# Patient Record
Sex: Female | Born: 1980 | Race: Black or African American | Hispanic: No | Marital: Single | Smoking: Never smoker
Health system: Southern US, Community
[De-identification: ages and names within clinical notes are randomized; demographics above are authoritative.]

---

## 2008-08-02 ENCOUNTER — Inpatient Hospital Stay (HOSPITAL_COMMUNITY): Admission: AD | Admit: 2008-08-02 | Discharge: 2008-08-06 | Payer: Self-pay | Admitting: Obstetrics and Gynecology

## 2008-08-03 ENCOUNTER — Encounter (INDEPENDENT_AMBULATORY_CARE_PROVIDER_SITE_OTHER): Payer: Self-pay | Admitting: Obstetrics

## 2010-04-15 LAB — CBC
Hemoglobin: 11.2 g/dL — ABNORMAL LOW (ref 12.0–15.0)
MCHC: 31.9 g/dL (ref 30.0–36.0)
MCV: 66.4 fL — ABNORMAL LOW (ref 78.0–100.0)
Platelets: 148 10*3/uL — ABNORMAL LOW (ref 150–400)
RDW: 16 % — ABNORMAL HIGH (ref 11.5–15.5)
WBC: 19.8 10*3/uL — ABNORMAL HIGH (ref 4.0–10.5)

## 2010-04-15 LAB — RPR: RPR Ser Ql: NONREACTIVE

## 2010-05-22 NOTE — Op Note (Signed)
Shelley English, Shelley English NO.:  [id number]   MEDICAL RECORD NO.:  [id number]           PATIENT TYPE:   LOCATION:                                 FACILITY:   PHYSICIAN:  Lendon Colonel, MD   DATE OF BIRTH:  Feb 15, 1980   DATE OF PROCEDURE:  08/03/2008  DATE OF DISCHARGE:                               OPERATIVE REPORT   PREOPERATIVE DIAGNOSIS:  Fetal intolerance to labor, arrest of descent,  persistent OP position.   POSTOPERATIVE DIAGNOSIS:  Fetal intolerance to labor, arrest of descent,  persistent OP position.   PROCEDURE:  Primary low transverse cesarean section.   SURGEON:  Alphonsus Sias. Ernestina Penna, MD   ASSISTANT:  None.   ANESTHESIA:  Epidural.   FINDINGS:  Female infant in the ROP position, Apgars 9 and 9, weight 5  pounds 9 ounces.  Normal ovaries, normal tubes.  Small 2 cm subserosal  fibroid in the right fundus, additional 2 cm fibroid in the left broad  ligament.   SPECIMENS:  Placenta to Pathology.   ANTIBIOTICS:  2 g of Ancef.   ESTIMATED BLOOD LOSS:  500.   COMPLICATIONS:  None.   INDICATIONS:  This is a 30 year old G1 at 39 weeks and 2 days who  presented for cervical ripening given chronic hypertension and mature  placenta on ultrasound.  The patient had cervical ripening overnight and  Pitocin induction; however, secondary to recurrent fetal decelerations,  Pitocin was discontinued.  The patient proceeded to progress into labor  on her own.  This was augmented with artificial rupture of membranes.  The patient throughout her labor course did have fetal decelerations  variable in nature; however, towards the end of the labor, these became  more pronounced, more prolonged, and more late in timing over the last  hour of labor.  The patient had late decelerations lasting 2-3 minutes  with most contractions going down into the 80s.  The patient also had  not changed her cervix passed the 9 cm and no fetal descent passed the 0  station, significant  molding and overriding sutures were noticed.  The  decision was made to proceed with the C-section.   PROCEDURE:  After informed consent was obtained, the patient was taken  to the operating room where epidural anesthesia was found to be  adequate.  2 g of Ancef were given and Foley catheter had already been  in place from the labor floor.  A Pfannenstiel skin incision was made 2  cm above the pubic symphysis in the midline with scalpel, carried  through to the underlying layer of fascia with Bovie cautery.  The  fascia was incised in midline.  The incision was extended laterally.  The inferior aspect of the fascial incision was grasped with Kocher  clamps, elevated up, and the underlying rectus muscles dissected off  sharply.  Superior aspect of the fascial incision was grasped with  Kocher clamps, elevated up, and the underlying rectus muscles dissected  off sharply.  The rectus muscles were separated in the midline.  Peritoneum was identified, entered bluntly.  Peritoneal incision was  extended superiorly and inferiorly with good visualization of the  bladder.  The bladder blade was inserted.  The lower uterine segment was  incised in transverse fashion with scalpel and extended bluntly.  The  infant's occiput was deep in the pelvis in the ROP position.  Once the  head was able to be turned flex, was brought through the incision, the  infant was delivered without complication.  No nuchal cord was noted.  The cord was clamped and cut.  The infant was handed off to the awaiting  pediatrician.  Attempt to send a cord gas was unsuccessful.  The  placenta was expressed.  The uterus was exteriorized, cleared of all  clots and debris.  The uterine incision was inspected.  A significant  left inferior extension was noted.  This was repaired primarily with 0  Vicryl in a running locked fashion.  The main uterine incision was then  repaired with 0 Vicryl in a running locked fashion.  The  extension was  then imbricated.  This extension came up to the right apex of the  incision and the imbrication was carried across to the left apex.  Several additional figure-of-eight sutures were placed down along the  extension for additional hemostasis.  The uterus was then returned to  the abdomen.  The gutters were cleared of all clots and debris.  The  uterine incision was re-inspected, found to be hemostatic.  The  peritoneum was closed with 2-0 Vicryl in a running fashion.  The cut  muscle edges on either side of the fascia were inspected, found to be  hemostatic.  The fascia was closed with 0 Vicryl in a running fashion in  a single layer.  The subcutaneous tissue was irrigated.  Several small  capillary bleeders were Bovie cauterized for hemostasis and the  subcutaneous tissue was closed with a 2-0 plain on a large needle.  Skin  was then closed with staples.  The patient tolerated the procedure well.  Sponge, lap, and needle counts were correct x3.  The patient was taken  to the recovery room in stable condition.      Lendon Colonel, MD  Electronically Signed     KAF/MEDQ  D:  08/03/2008  T:  08/04/2008  Job:  [id number]

## 2017-11-18 ENCOUNTER — Encounter (HOSPITAL_COMMUNITY): Payer: Self-pay | Admitting: Emergency Medicine

## 2017-11-18 ENCOUNTER — Other Ambulatory Visit: Payer: Self-pay

## 2017-11-18 ENCOUNTER — Emergency Department (HOSPITAL_COMMUNITY)
Admission: EM | Admit: 2017-11-18 | Discharge: 2017-11-19 | Disposition: A | Discharge status: Home / Self Care | Payer: BLUE CROSS/BLUE SHIELD | Attending: Emergency Medicine | Admitting: Emergency Medicine

## 2017-11-18 DIAGNOSIS — N200 Calculus of kidney: Secondary | ICD-10-CM | POA: Insufficient documentation

## 2017-11-18 DIAGNOSIS — R109 Unspecified abdominal pain: Secondary | ICD-10-CM | POA: Diagnosis present

## 2017-11-18 MED ORDER — ONDANSETRON HCL 4 MG/2ML IJ SOLN
4.0000 mg | Freq: Once | INTRAMUSCULAR | Status: AC
Start: 2017-11-18 — End: 2017-11-18
  Administered 2017-11-18: 4 mg via INTRAVENOUS
  Filled 2017-11-18: qty 2

## 2017-11-18 MED ORDER — KETOROLAC TROMETHAMINE 30 MG/ML IJ SOLN
30.0000 mg | Freq: Once | INTRAMUSCULAR | Status: AC
Start: 2017-11-18 — End: 2017-11-18
  Administered 2017-11-18: 30 mg via INTRAVENOUS
  Filled 2017-11-18: qty 1

## 2017-11-18 NOTE — ED Triage Notes (Signed)
Pt reports sudden onset  R flank pain tonight. Pt reports nausea, no vomiting. Pt denies changes in urination.

## 2017-11-18 NOTE — ED Provider Notes (Signed)
TIME SEEN: 11:12 PM  CHIEF COMPLAINT: Right flank pain  HPI: Patient is a 37 year old female with no significant past medical history who presents to the emergency department with sudden onset right-sided flank pain that started tonight at 7:30 PM.  No injury to her back that she can recall.  Pain is not worse with movement.  She does not notice any aggravating or relieving factors.  Describes it as sharp, severe, waxing and waning.  Did have nausea and vomiting.  No previous history of kidney stone.  No diarrhea.  No fever.  No dysuria or hematuria.  Has had previous C-section.  Denies numbness, tingling or focal weakness.  ROS: See HPI Constitutional: no fever  Eyes: no drainage  ENT: no runny nose   Cardiovascular:  no chest pain  Resp: no SOB  GI:  vomiting GU: no dysuria Integumentary: no rash  Allergy: no hives  Musculoskeletal: no leg swelling  Neurological: no slurred speech ROS otherwise negative  PAST MEDICAL HISTORY/PAST SURGICAL HISTORY:  History reviewed. No pertinent past medical history.  MEDICATIONS:  Prior to Admission medications   Not on File    ALLERGIES:  No Known Allergies  SOCIAL HISTORY:  Social History   Tobacco Use  . Smoking status: Never Smoker  . Smokeless tobacco: Never Used  Substance Use Topics  . Alcohol use: Never    Frequency: Never    FAMILY HISTORY: No family history on file.  EXAM: BP 113/72 (BP Location: Right Arm)   Pulse 80   Temp 98 F (36.7 C) (Oral)   Resp 18   Ht 5\' 1"  (1.549 m)   Wt 90.7 kg   SpO2 100%   BMI 37.79 kg/m  CONSTITUTIONAL: Alert and oriented and responds appropriately to questions. Well-appearing; well-nourished HEAD: Normocephalic EYES: Conjunctivae clear, pupils appear equal, EOMI ENT: normal nose; moist mucous membranes NECK: Supple, no meningismus, no nuchal rigidity, no LAD  CARD: RRR; S1 and S2 appreciated; no murmurs, no clicks, no rubs, no gallops RESP: Normal chest excursion without  splinting or tachypnea; breath sounds clear and equal bilaterally; no wheezes, no rhonchi, no rales, no hypoxia or respiratory distress, speaking full sentences ABD/GI: Normal bowel sounds; non-distended; soft, non-tender, no rebound, no guarding, no peritoneal signs, no hepatosplenomegaly, no tenderness over McBurney's point, negative Murphy sign BACK:  The back appears normal and is non-tender to palpation, there is no CVA tenderness, no midline spinal tenderness or step-off or deformity EXT: Normal ROM in all joints; non-tender to palpation; no edema; normal capillary refill; no cyanosis, no calf tenderness or swelling    SKIN: Normal color for age and race; warm; no rash NEURO: Moves all extremities equally normal gait PSYCH: The patient's mood and manner are appropriate. Grooming and personal hygiene are appropriate.  MEDICAL DECISION MAKING: Patient here with sudden onset flank pain.  I am concerned for possible kidney stone.  Seems less likely musculoskeletal in nature given sudden onset no history of injury to the back.  Doubt fracture, cauda equina, epidural abscess or hematoma, discitis or osteomyelitis, transverse myelitis.  She has no tenderness over her gallbladder or appendix at this time.  Will obtain labs, urine, CT imaging.  We will treat symptomatically with Toradol and Zofran.  ED PROGRESS: 1:40 AM  CT scan shows diffuse right-sided perinephric stranding and fluid with a 2 mm stone at the right distal ureter at the UVJ.  She has moderate right-sided hydronephrosis.  Reports no significant improvement after Toradol.  She now has someone  here who can drive her home.  Will give dose of morphine.  Urine pending.   2:20 AM  Pt feels her pain is been well controlled after morphine.  Urine shows no sign of infection.  She does have small leukocytes and rare bacteria but also many squamous cells.  I feel she is safe for discharge home with outpatient urology follow-up.  Will discharge with  urine strainer.  Will discharge with prescriptions for Percocet, Zofran, Flomax, ibuprofen.  Discussed return precautions.   At this time, I do not feel there is any life-threatening condition present. I have reviewed and discussed all results (EKG, imaging, lab, urine as appropriate) and exam findings with patient/family. I have reviewed nursing notes and appropriate previous records.  I feel the patient is safe to be discharged home without further emergent workup and can continue workup as an outpatient as needed. Discussed usual and customary return precautions. Patient/family verbalize understanding and are comfortable with this plan.  Outpatient follow-up has been provided if needed. All questions have been answered.     Ares Cardozo, Layla Maw, DO 11/19/17 Earle Gell

## 2017-11-19 ENCOUNTER — Emergency Department (HOSPITAL_COMMUNITY): Payer: BLUE CROSS/BLUE SHIELD

## 2017-11-19 LAB — COMPREHENSIVE METABOLIC PANEL
ALK PHOS: 65 U/L (ref 38–126)
ALT: 10 U/L (ref 0–44)
ANION GAP: 8 (ref 5–15)
AST: 17 U/L (ref 15–41)
Albumin: 3.7 g/dL (ref 3.5–5.0)
BUN: 10 mg/dL (ref 6–20)
CO2: 20 mmol/L — ABNORMAL LOW (ref 22–32)
CREATININE: 0.82 mg/dL (ref 0.44–1.00)
Calcium: 8.5 mg/dL — ABNORMAL LOW (ref 8.9–10.3)
Chloride: 108 mmol/L (ref 98–111)
Glucose, Bld: 107 mg/dL — ABNORMAL HIGH (ref 70–99)
Potassium: 3.8 mmol/L (ref 3.5–5.1)
Sodium: 136 mmol/L (ref 135–145)
Total Bilirubin: 0.6 mg/dL (ref 0.3–1.2)
Total Protein: 7.4 g/dL (ref 6.5–8.1)

## 2017-11-19 LAB — CBC WITH DIFFERENTIAL/PLATELET
Abs Immature Granulocytes: 0.05 10*3/uL (ref 0.00–0.07)
BASOS ABS: 0.1 10*3/uL (ref 0.0–0.1)
Basophils Relative: 0 %
EOS ABS: 0 10*3/uL (ref 0.0–0.5)
Eosinophils Relative: 0 %
HCT: 33.5 % — ABNORMAL LOW (ref 36.0–46.0)
HEMOGLOBIN: 9 g/dL — AB (ref 12.0–15.0)
Immature Granulocytes: 0 %
LYMPHS ABS: 1.8 10*3/uL (ref 0.7–4.0)
Lymphocytes Relative: 14 %
MCH: 16.9 pg — ABNORMAL LOW (ref 26.0–34.0)
MCHC: 26.9 g/dL — AB (ref 30.0–36.0)
MCV: 62.9 fL — ABNORMAL LOW (ref 80.0–100.0)
Monocytes Absolute: 0.4 10*3/uL (ref 0.1–1.0)
Monocytes Relative: 3 %
NRBC: 0 % (ref 0.0–0.2)
Neutro Abs: 10.5 10*3/uL — ABNORMAL HIGH (ref 1.7–7.7)
Neutrophils Relative %: 83 %
Platelets: 297 10*3/uL (ref 150–400)
RBC: 5.33 MIL/uL — ABNORMAL HIGH (ref 3.87–5.11)
RDW: 18.6 % — ABNORMAL HIGH (ref 11.5–15.5)
WBC: 12.9 10*3/uL — ABNORMAL HIGH (ref 4.0–10.5)

## 2017-11-19 LAB — URINALYSIS, ROUTINE W REFLEX MICROSCOPIC
BILIRUBIN URINE: NEGATIVE
GLUCOSE, UA: NEGATIVE mg/dL
HGB URINE DIPSTICK: NEGATIVE
KETONES UR: NEGATIVE mg/dL
NITRITE: NEGATIVE
PROTEIN: NEGATIVE mg/dL
Specific Gravity, Urine: 1.021 (ref 1.005–1.030)
pH: 7 (ref 5.0–8.0)

## 2017-11-19 LAB — I-STAT BETA HCG BLOOD, ED (MC, WL, AP ONLY)

## 2017-11-19 LAB — LIPASE, BLOOD: Lipase: 27 U/L (ref 11–51)

## 2017-11-19 MED ORDER — OXYCODONE-ACETAMINOPHEN 5-325 MG PO TABS: 2.0000 | ORAL_TABLET | Freq: Four times a day (QID) | ORAL | 0 refills | Status: DC | PRN

## 2017-11-19 MED ORDER — IBUPROFEN 800 MG PO TABS: 800.0000 mg | ORAL_TABLET | Freq: Three times a day (TID) | ORAL | 0 refills | Status: DC | PRN

## 2017-11-19 MED ORDER — TAMSULOSIN HCL 0.4 MG PO CAPS: 0.4000 mg | ORAL_CAPSULE | Freq: Every day | ORAL | 0 refills | Status: DC

## 2017-11-19 MED ORDER — MORPHINE SULFATE (PF) 4 MG/ML IV SOLN
4.0000 mg | Freq: Once | INTRAVENOUS | Status: AC
  Administered 2017-11-19: 4 mg via INTRAVENOUS
  Filled 2017-11-19: qty 1

## 2017-11-19 MED ORDER — ONDANSETRON 4 MG PO TBDP: 4.0000 mg | ORAL_TABLET | Freq: Four times a day (QID) | ORAL | 0 refills | Status: DC | PRN

## 2017-11-19 NOTE — ED Notes (Signed)
ED Provider at bedside. 

## 2018-06-15 ENCOUNTER — Other Ambulatory Visit: Payer: Self-pay

## 2018-06-15 ENCOUNTER — Other Ambulatory Visit: Payer: BLUE CROSS/BLUE SHIELD

## 2018-06-15 ENCOUNTER — Other Ambulatory Visit: Payer: Self-pay | Admitting: Internal Medicine

## 2018-06-15 DIAGNOSIS — Z20822 Contact with and (suspected) exposure to covid-19: Secondary | ICD-10-CM

## 2018-06-18 LAB — NOVEL CORONAVIRUS, NAA: SARS-CoV-2, NAA: NOT DETECTED

## 2019-05-20 IMAGING — CT CT RENAL STONE PROTOCOL
2 of 4 series · 16 of 46 positions shown, 18 images · non-contrast
Comparison: None.

CLINICAL DATA: Acute onset of right flank pain and nausea.

EXAM:
CT ABDOMEN AND PELVIS WITHOUT CONTRAST
TECHNIQUE: Multidetector CT imaging of the abdomen and pelvis was performed
following the standard protocol without IV contrast.

[Series 3: renal stone 5.0 · axial · 0.95mm/px · z∈[+924,+1354]mm · 13 of 94 slices shown, 15 images]
[im 4/94  soft-tissue]
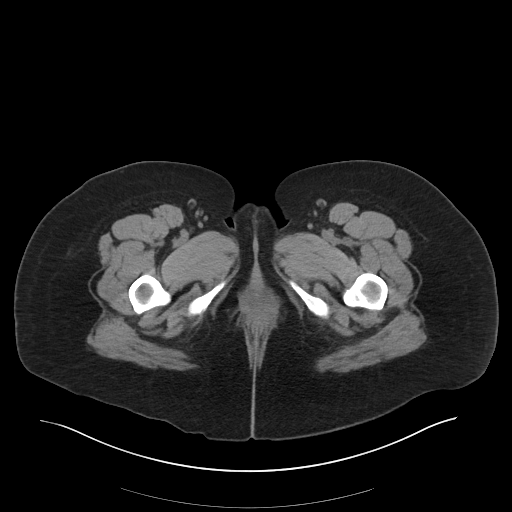
[im 4/94  bone]
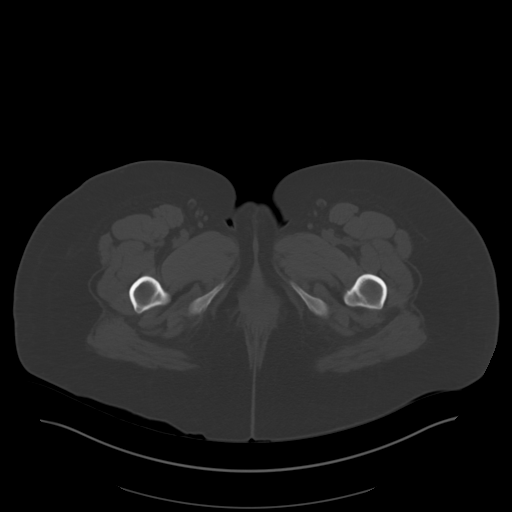
[im 12/94  soft-tissue]
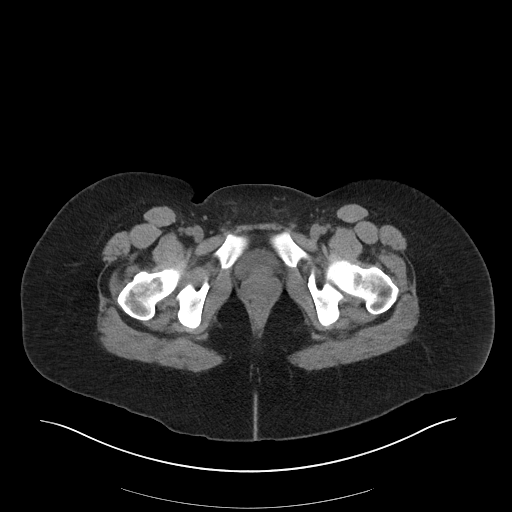
[im 20/94  soft-tissue]
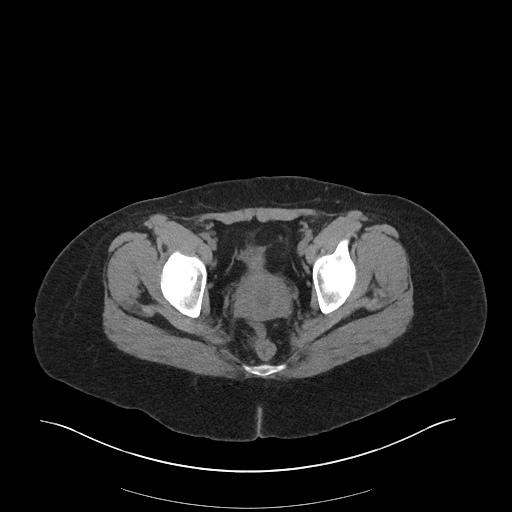
[im 28/94  soft-tissue]
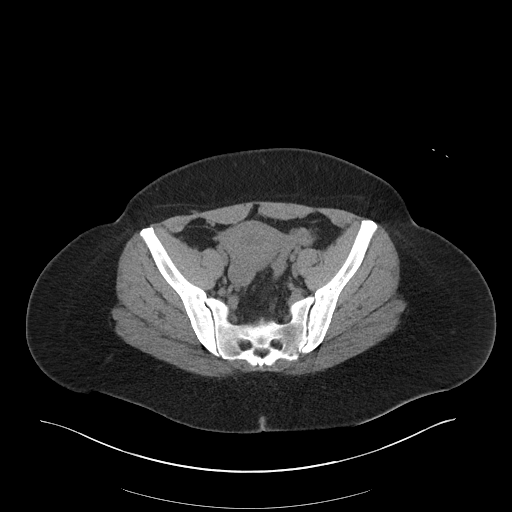
[im 32/94  soft-tissue]
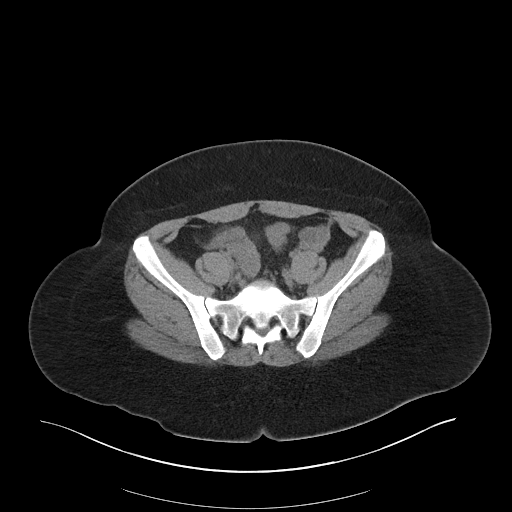
[im 39/94  soft-tissue]
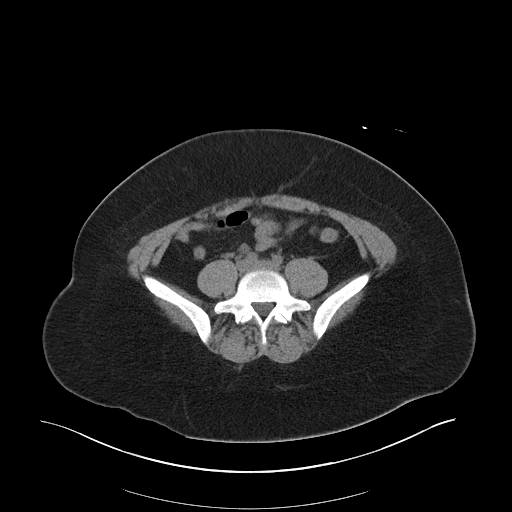
[im 47/94  soft-tissue]
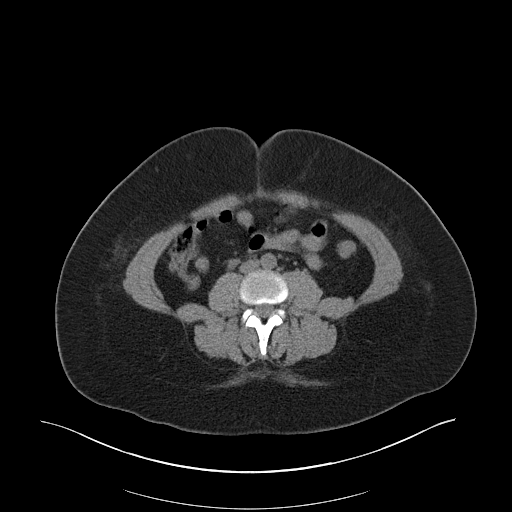
[im 55/94  soft-tissue]
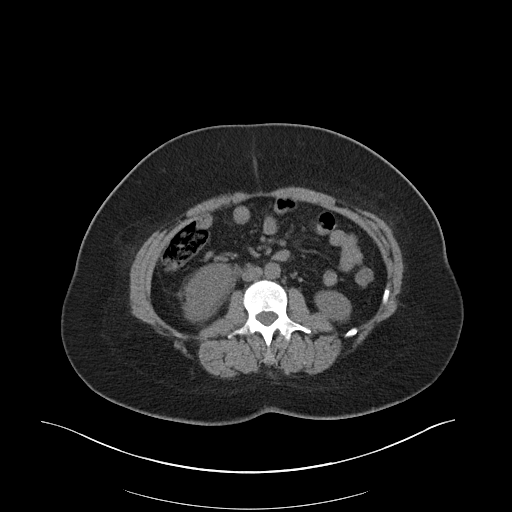
[im 63/94  soft-tissue]
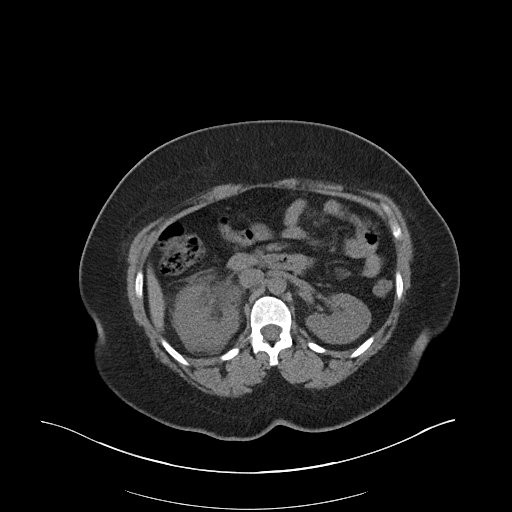
[im 63/94  bone]
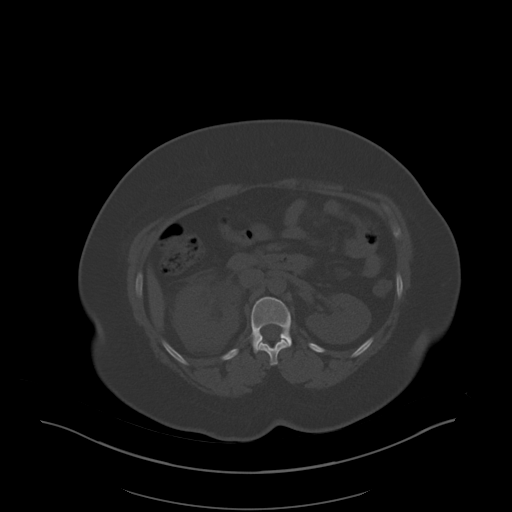
[im 66/94  soft-tissue]
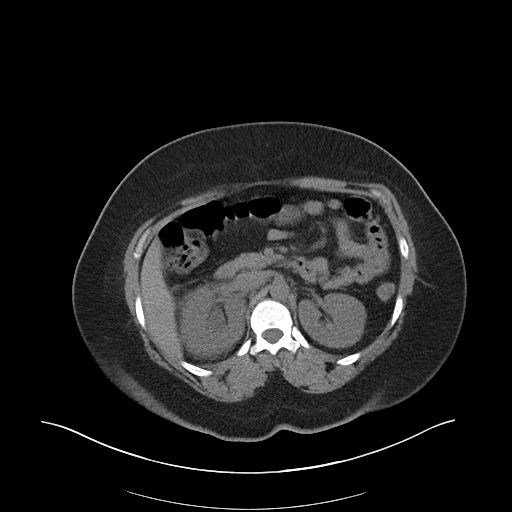
[im 74/94  soft-tissue]
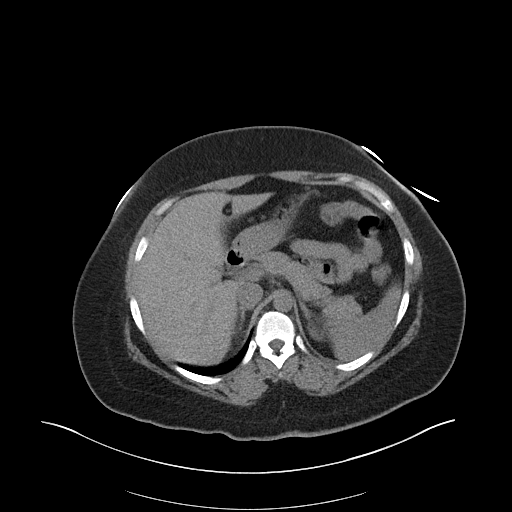
[im 82/94  soft-tissue]
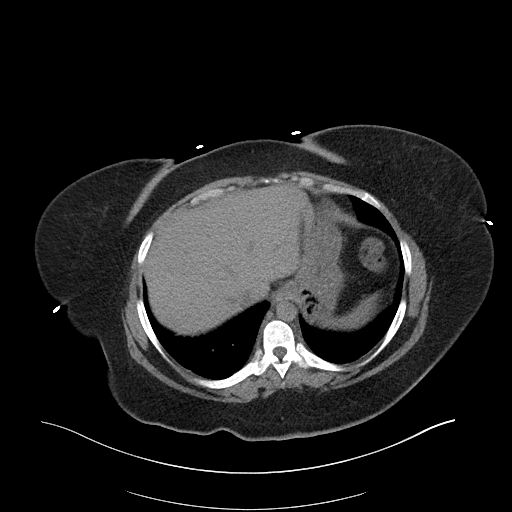
[im 90/94  soft-tissue]
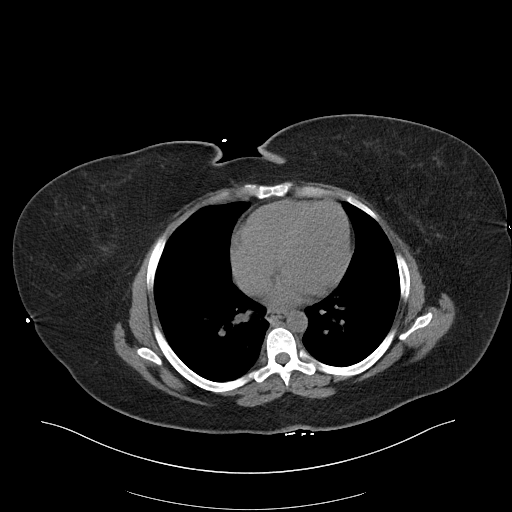

[Series 6: renal stone 3.0 cor · coronal · 0.92mm/px · 3 of 84 slices shown]
[im 28/84  soft-tissue]
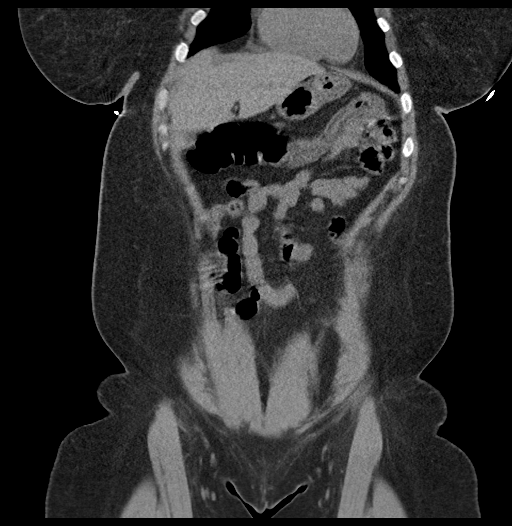
[im 37/84  soft-tissue]
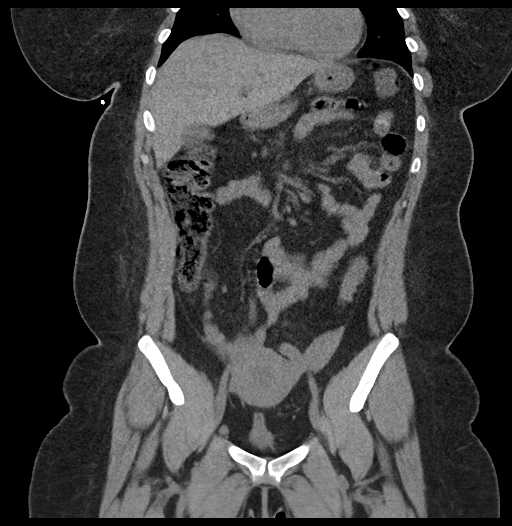
[im 47/84  soft-tissue]
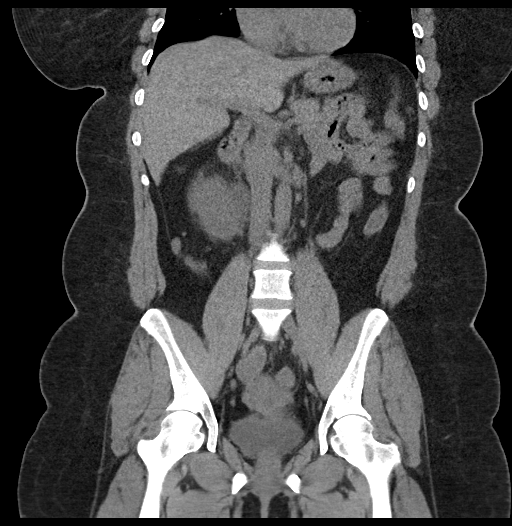

[16 of 46 positions shown; findings below may reference images not displayed]

FINDINGS: Lower chest: Minimal right basilar atelectasis is noted. The
visualized portions of the mediastinum are unremarkable.

Hepatobiliary: The liver is unremarkable in appearance. The
gallbladder is unremarkable in appearance. The common bile duct
remains normal in caliber.

Pancreas: The pancreas is within normal limits.

Spleen: The spleen is unremarkable in appearance.

Adrenals/Urinary Tract: The adrenal glands are unremarkable in
appearance.

Moderate right-sided hydronephrosis is noted, with prominence of the
right ureter along its entire course. A 2 mm stone is noted at the
distal right ureter, along the right vesicoureteral junction.
Diffuse right-sided perinephric stranding and fluid are seen. The
left kidney is unremarkable. No nonobstructing renal stones are
identified.

Stomach/Bowel: The stomach is unremarkable in appearance. The small
bowel is within normal limits. The appendix is normal in caliber,
without evidence of appendicitis. A few diverticula are noted along
the descending colon. The colon is otherwise unremarkable.

Vascular/Lymphatic: The abdominal aorta is unremarkable in
appearance. The inferior vena cava is grossly unremarkable. No
retroperitoneal lymphadenopathy is seen. No pelvic sidewall
lymphadenopathy is identified.

Reproductive: The bladder is decompressed and not well
characterized. The uterus is unremarkable in appearance. The ovaries
are relatively symmetric. No suspicious adnexal masses are seen.

Other: No additional soft tissue abnormalities are seen.

Musculoskeletal: No acute osseous abnormalities are identified. The
visualized musculature is unremarkable in appearance.
IMPRESSION: Moderate right-sided hydronephrosis, with diffuse right-sided
perinephric stranding and fluid. 2 mm stone at the distal right
ureter, along the right vesicoureteral junction.

## 2020-01-22 ENCOUNTER — Other Ambulatory Visit: Payer: BC Managed Care – PPO

## 2020-01-22 DIAGNOSIS — Z20822 Contact with and (suspected) exposure to covid-19: Secondary | ICD-10-CM

## 2020-01-25 LAB — NOVEL CORONAVIRUS, NAA: SARS-CoV-2, NAA: NOT DETECTED

## 2020-07-12 DIAGNOSIS — Z124 Encounter for screening for malignant neoplasm of cervix: Secondary | ICD-10-CM | POA: Diagnosis not present

## 2020-07-12 DIAGNOSIS — N926 Irregular menstruation, unspecified: Secondary | ICD-10-CM | POA: Diagnosis not present

## 2020-07-12 DIAGNOSIS — Z114 Encounter for screening for human immunodeficiency virus [HIV]: Secondary | ICD-10-CM | POA: Diagnosis not present

## 2020-07-12 DIAGNOSIS — Z1159 Encounter for screening for other viral diseases: Secondary | ICD-10-CM | POA: Diagnosis not present

## 2020-07-12 DIAGNOSIS — Z01419 Encounter for gynecological examination (general) (routine) without abnormal findings: Secondary | ICD-10-CM | POA: Diagnosis not present

## 2020-07-12 DIAGNOSIS — Z6841 Body Mass Index (BMI) 40.0 and over, adult: Secondary | ICD-10-CM | POA: Diagnosis not present

## 2020-07-12 DIAGNOSIS — Z113 Encounter for screening for infections with a predominantly sexual mode of transmission: Secondary | ICD-10-CM | POA: Diagnosis not present

## 2020-07-12 DIAGNOSIS — Z118 Encounter for screening for other infectious and parasitic diseases: Secondary | ICD-10-CM | POA: Diagnosis not present

## 2020-12-21 DIAGNOSIS — Z0001 Encounter for general adult medical examination with abnormal findings: Secondary | ICD-10-CM | POA: Diagnosis not present

## 2020-12-21 DIAGNOSIS — Z13220 Encounter for screening for lipoid disorders: Secondary | ICD-10-CM | POA: Diagnosis not present

## 2020-12-21 DIAGNOSIS — Z1331 Encounter for screening for depression: Secondary | ICD-10-CM | POA: Diagnosis not present

## 2020-12-21 DIAGNOSIS — R030 Elevated blood-pressure reading, without diagnosis of hypertension: Secondary | ICD-10-CM | POA: Diagnosis not present

## 2020-12-21 DIAGNOSIS — Z6841 Body Mass Index (BMI) 40.0 and over, adult: Secondary | ICD-10-CM | POA: Diagnosis not present

## 2021-12-21 DIAGNOSIS — E559 Vitamin D deficiency, unspecified: Secondary | ICD-10-CM | POA: Diagnosis not present

## 2021-12-21 DIAGNOSIS — Z6841 Body Mass Index (BMI) 40.0 and over, adult: Secondary | ICD-10-CM | POA: Diagnosis not present

## 2021-12-21 DIAGNOSIS — Z1331 Encounter for screening for depression: Secondary | ICD-10-CM | POA: Diagnosis not present

## 2021-12-21 DIAGNOSIS — Z0001 Encounter for general adult medical examination with abnormal findings: Secondary | ICD-10-CM | POA: Diagnosis not present

## 2021-12-21 DIAGNOSIS — D563 Thalassemia minor: Secondary | ICD-10-CM | POA: Diagnosis not present

## 2022-12-14 DIAGNOSIS — Z0000 Encounter for general adult medical examination without abnormal findings: Secondary | ICD-10-CM | POA: Diagnosis not present

## 2023-01-11 ENCOUNTER — Ambulatory Visit
Admission: EM | Admit: 2023-01-11 | Discharge: 2023-01-11 | Disposition: A | Discharge status: Home / Self Care | Payer: BC Managed Care – PPO | Attending: Nurse Practitioner | Admitting: Nurse Practitioner

## 2023-01-11 ENCOUNTER — Encounter: Payer: Self-pay | Admitting: Emergency Medicine

## 2023-01-11 DIAGNOSIS — J029 Acute pharyngitis, unspecified: Secondary | ICD-10-CM | POA: Diagnosis not present

## 2023-01-11 DIAGNOSIS — J22 Unspecified acute lower respiratory infection: Secondary | ICD-10-CM | POA: Insufficient documentation

## 2023-01-11 DIAGNOSIS — J040 Acute laryngitis: Secondary | ICD-10-CM | POA: Insufficient documentation

## 2023-01-11 LAB — POCT RAPID STREP A (OFFICE): Rapid Strep A Screen: NEGATIVE

## 2023-01-11 MED ORDER — PROMETHAZINE-DM 6.25-15 MG/5ML PO SYRP: 5.0000 mL | ORAL_SOLUTION | Freq: Four times a day (QID) | ORAL | 0 refills | Status: AC | PRN

## 2023-01-11 MED ORDER — ALBUTEROL SULFATE HFA 108 (90 BASE) MCG/ACT IN AERS: 2.0000 | INHALATION_SPRAY | Freq: Four times a day (QID) | RESPIRATORY_TRACT | 0 refills | Status: AC | PRN

## 2023-01-11 MED ORDER — AZITHROMYCIN 250 MG PO TABS: 250.0000 mg | ORAL_TABLET | Freq: Every day | ORAL | 0 refills | Status: AC

## 2023-01-11 MED ORDER — PREDNISONE 20 MG PO TABS: 40.0000 mg | ORAL_TABLET | Freq: Every day | ORAL | 0 refills | Status: AC

## 2023-01-11 NOTE — ED Triage Notes (Signed)
 Productive Cough x 2 weeks.  Sore throat today and lost voice.  Has been taking mucinex with little relief.

## 2023-01-11 NOTE — ED Provider Notes (Signed)
 [facility]    CSN: [id number] Arrival date & time: 01/11/23  9162      History   Chief Complaint No chief complaint on file.   HPI Shelley English is a 43 y.o. female.   The history is provided by the patient.   Patient presents for complaints of cough, sore throat, and hoarseness that started over the past 2 weeks.  Patient states sore throat started 2 to 3 days ago, with cough present approximately 2 weeks ago.  She states that cough is worse at night, cough is productive of green sputum.  Also states that she has episodes of coughing that are difficult to control.  Sore throat worsens with cough.  States that she does have a lot of postnasal drainage.  Denies fever, chills, headache, ear pain, wheezing, shortness of breath, difficulty breathing, chest pain, abdominal pain, nausea, vomiting, diarrhea, or rash.  Patient reports she has been taking Mucinex with minimal relief of her symptoms.  History reviewed. No pertinent past medical history.  There are no active problems to display for this patient.   History reviewed. No pertinent surgical history.  OB History   No obstetric history on file.      Home Medications    Prior to Admission medications   Medication Sig Start Date End Date Taking? Authorizing Provider  albuterol  (VENTOLIN  HFA) 108 (90 Base) MCG/ACT inhaler Inhale 2 puffs into the lungs every 6 (six) hours as needed. 01/11/23  Yes Leath-Warren, Etta PARAS, NP  azithromycin  (ZITHROMAX ) 250 MG tablet Take 1 tablet (250 mg total) by mouth daily. Take first 2 tablets together, then 1 every day until finished. 01/11/23  Yes Leath-Warren, Etta PARAS, NP  predniSONE  (DELTASONE ) 20 MG tablet Take 2 tablets (40 mg total) by mouth daily with breakfast for 5 days. 01/11/23 01/16/23 Yes Leath-Warren, Etta PARAS, NP  promethazine -dextromethorphan (PROMETHAZINE -DM) 6.25-15 MG/5ML syrup Take 5 mLs by mouth 4 (four) times daily as needed. 01/11/23  Yes Leath-Warren, Etta PARAS, NP     Family History History reviewed. No pertinent family history.  Social History Social History   Tobacco Use   Smoking status: Never   Smokeless tobacco: Never  Vaping Use   Vaping status: Never Used  Substance Use Topics   Alcohol use: Never   Drug use: Never     Allergies   Patient has no known allergies.   Review of Systems Review of Systems Per HPI  Physical Exam Triage Vital Signs ED Triage Vitals  Encounter Vitals Group     BP 01/11/23 0847 (!) 140/98     Systolic BP Percentile --      Diastolic BP Percentile --      Pulse Rate 01/11/23 0847 (!) 125     Resp 01/11/23 0847 18     Temp 01/11/23 0847 99 F (37.2 C)     Temp Source 01/11/23 0847 Oral     SpO2 01/11/23 0847 98 %     Weight --      Height --      Head Circumference --      Peak Flow --      Pain Score 01/11/23 0848 3     Pain Loc --      Pain Education --      Exclude from Growth Chart --    No data found.  Updated Vital Signs BP (!) 140/98 (BP Location: Right Arm)   Pulse (!) 125   Temp 99 F (37.2 C) (Oral)  Resp 18   LMP 12/30/2022 (Exact Date)   SpO2 98%   Visual Acuity Right Eye Distance:   Left Eye Distance:   Bilateral Distance:    Right Eye Near:   Left Eye Near:    Bilateral Near:     Physical Exam Vitals and nursing note reviewed.  Constitutional:      General: She is not in acute distress.    Appearance: Normal appearance.  HENT:     Head: Normocephalic.     Right Ear: Tympanic membrane, ear canal and external ear normal.     Left Ear: Tympanic membrane, ear canal and external ear normal.     Nose: Congestion present.     Right Turbinates: Enlarged and swollen.     Left Turbinates: Enlarged and swollen.     Right Sinus: No maxillary sinus tenderness or frontal sinus tenderness.     Left Sinus: No maxillary sinus tenderness or frontal sinus tenderness.     Mouth/Throat:     Lips: Pink.     Mouth: Mucous membranes are moist.     Pharynx: Posterior  oropharyngeal erythema and postnasal drip present. No pharyngeal swelling, oropharyngeal exudate or uvula swelling.     Comments: Cobblestoning present to posterior oropharynx  Eyes:     Extraocular Movements: Extraocular movements intact.     Conjunctiva/sclera: Conjunctivae normal.     Pupils: Pupils are equal, round, and reactive to light.  Cardiovascular:     Rate and Rhythm: Regular rhythm. Tachycardia present.     Pulses: Normal pulses.     Heart sounds: Normal heart sounds.  Pulmonary:     Effort: Pulmonary effort is normal.     Breath sounds: Normal breath sounds.  Abdominal:     General: Bowel sounds are normal.     Palpations: Abdomen is soft.     Tenderness: There is no abdominal tenderness.  Musculoskeletal:     Cervical back: Normal range of motion.  Lymphadenopathy:     Cervical: No cervical adenopathy.  Skin:    General: Skin is warm and dry.  Neurological:     General: No focal deficit present.     Mental Status: She is alert and oriented to person, place, and time.  Psychiatric:        Mood and Affect: Mood normal.        Behavior: Behavior normal.      UC Treatments / Results  Labs (all labs ordered are listed, but only abnormal results are displayed) Labs Reviewed  CULTURE, GROUP A STREP [facility])  POCT RAPID STREP A (OFFICE)    EKG   Radiology No results found.  Procedures Procedures (including critical care time)  Medications Ordered in UC Medications - No data to display  Initial Impression / Assessment and Plan / UC Course  I have reviewed the triage vital signs and the nursing notes.  Pertinent labs & imaging results that were available during my care of the patient were reviewed by me and considered in my medical decision making (see chart for details).  On exam, lung sounds are clear throughout, room air sats at 98%.  Cough has been persistent for 2 weeks with minimal relief using over-the-counter medications.  Suspect patient has a  lower respiratory infection.  Will treat with azithromycin  250 mg, Promethazine  DM for the cough, prednisone  40 mg for 5 days for bronchial inflammation, and an albuterol  inhaler as needed for shortness of breath and bronchial spasms.  Supportive care recommendations were provided  and discussed with the patient to include fluids, rest, over-the-counter analgesics, and use of a humidifier at nighttime during sleep.  Discussed indications with the patient regarding follow-up.  Patient was in agreement with this plan of care and verbalizes understanding.  All questions were answered.  Patient stable for discharge.  Final Clinical Impressions(s) / UC Diagnoses   Final diagnoses:  Sore throat  Lower respiratory infection  Laryngitis     Discharge Instructions      The rapid strep test was negative. A throat culture is pending. You will be contacted if the result is abnormal. You will also have access to the results via MyChart.  Take medication as prescribed. Increase fluids and allow for plenty of rest. Recommend Tylenol  or Ibuprofen  as needed for pain, fever, or general discomfort. Warm salt water gargles 3-4 times daily to help with throat pain or discomfort. Recommend using a humidifier at bedtime during sleep to help with cough and nasal congestion. Sleep elevated on 2 pillows. Voice rest while symptoms persist. If symptoms do not improve with this treatment, you may follow-up in this clinic or with your PCP. Follow-up as needed.      ED Prescriptions     Medication Sig Dispense Auth. Provider   predniSONE  (DELTASONE ) 20 MG tablet Take 2 tablets (40 mg total) by mouth daily with breakfast for 5 days. 10 tablet Leath-Warren, Etta PARAS, NP   promethazine -dextromethorphan (PROMETHAZINE -DM) 6.25-15 MG/5ML syrup Take 5 mLs by mouth 4 (four) times daily as needed. 118 mL Leath-Warren, Etta PARAS, NP   albuterol  (VENTOLIN  HFA) 108 (90 Base) MCG/ACT inhaler Inhale 2 puffs into the lungs  every 6 (six) hours as needed. 8 g Leath-Warren, Etta PARAS, NP   azithromycin  (ZITHROMAX ) 250 MG tablet Take 1 tablet (250 mg total) by mouth daily. Take first 2 tablets together, then 1 every day until finished. 6 tablet Leath-Warren, Etta PARAS, NP      PDMP not reviewed this encounter.   Gilmer Etta PARAS, NP 01/11/23 [phone]

## 2023-01-11 NOTE — Discharge Instructions (Addendum)
 The rapid strep test was negative. A throat culture is pending. You will be contacted if the result is abnormal. You will also have access to the results via MyChart.  Take medication as prescribed. Increase fluids and allow for plenty of rest. Recommend Tylenol  or Ibuprofen  as needed for pain, fever, or general discomfort. Warm salt water gargles 3-4 times daily to help with throat pain or discomfort. Recommend using a humidifier at bedtime during sleep to help with cough and nasal congestion. Sleep elevated on 2 pillows. Voice rest while symptoms persist. If symptoms do not improve with this treatment, you may follow-up in this clinic or with your PCP. Follow-up as needed.

## 2023-01-14 LAB — CULTURE, GROUP A STREP (THRC)

## 2023-01-24 DIAGNOSIS — Z01419 Encounter for gynecological examination (general) (routine) without abnormal findings: Secondary | ICD-10-CM | POA: Diagnosis not present

## 2023-01-24 DIAGNOSIS — Z1159 Encounter for screening for other viral diseases: Secondary | ICD-10-CM | POA: Diagnosis not present

## 2023-01-24 DIAGNOSIS — Z118 Encounter for screening for other infectious and parasitic diseases: Secondary | ICD-10-CM | POA: Diagnosis not present

## 2023-01-24 DIAGNOSIS — Z114 Encounter for screening for human immunodeficiency virus [HIV]: Secondary | ICD-10-CM | POA: Diagnosis not present

## 2023-01-24 DIAGNOSIS — Z113 Encounter for screening for infections with a predominantly sexual mode of transmission: Secondary | ICD-10-CM | POA: Diagnosis not present

## 2023-01-24 DIAGNOSIS — Z124 Encounter for screening for malignant neoplasm of cervix: Secondary | ICD-10-CM | POA: Diagnosis not present

## 2023-01-24 DIAGNOSIS — Z1331 Encounter for screening for depression: Secondary | ICD-10-CM | POA: Diagnosis not present

## 2023-01-24 DIAGNOSIS — Z0142 Encounter for cervical smear to confirm findings of recent normal smear following initial abnormal smear: Secondary | ICD-10-CM | POA: Diagnosis not present

## 2023-01-24 DIAGNOSIS — Z01411 Encounter for gynecological examination (general) (routine) with abnormal findings: Secondary | ICD-10-CM | POA: Diagnosis not present

## 2023-01-24 DIAGNOSIS — Z1231 Encounter for screening mammogram for malignant neoplasm of breast: Secondary | ICD-10-CM | POA: Diagnosis not present

## 2023-02-14 DIAGNOSIS — R921 Mammographic calcification found on diagnostic imaging of breast: Secondary | ICD-10-CM | POA: Diagnosis not present

## 2023-02-14 DIAGNOSIS — R92321 Mammographic fibroglandular density, right breast: Secondary | ICD-10-CM | POA: Diagnosis not present

## 2023-11-27 ENCOUNTER — Ambulatory Visit: Admitting: Family Medicine

## 2023-11-30 DIAGNOSIS — Z13220 Encounter for screening for lipoid disorders: Secondary | ICD-10-CM | POA: Diagnosis not present

## 2023-11-30 DIAGNOSIS — R030 Elevated blood-pressure reading, without diagnosis of hypertension: Secondary | ICD-10-CM | POA: Diagnosis not present

## 2023-11-30 DIAGNOSIS — Z131 Encounter for screening for diabetes mellitus: Secondary | ICD-10-CM | POA: Diagnosis not present

## 2023-11-30 DIAGNOSIS — Z0001 Encounter for general adult medical examination with abnormal findings: Secondary | ICD-10-CM | POA: Diagnosis not present
